# Patient Record
Sex: Female | Born: 1935 | Race: White | Hispanic: No | Marital: Single
Health system: Southern US, Community
[De-identification: ages and names within clinical notes are randomized; demographics above are authoritative.]

---

## 2006-01-01 ENCOUNTER — Ambulatory Visit: Payer: Self-pay | Admitting: Internal Medicine

## 2006-06-19 ENCOUNTER — Ambulatory Visit (HOSPITAL_COMMUNITY): Admission: RE | Admit: 2006-06-19 | Discharge: 2006-06-20 | Payer: Self-pay | Admitting: Ophthalmology

## 2008-06-02 ENCOUNTER — Other Ambulatory Visit: Payer: Self-pay

## 2008-06-02 ENCOUNTER — Emergency Department: Payer: Self-pay | Admitting: Emergency Medicine

## 2008-06-26 ENCOUNTER — Ambulatory Visit: Payer: Self-pay | Admitting: Vascular Surgery

## 2011-08-03 ENCOUNTER — Encounter (INDEPENDENT_AMBULATORY_CARE_PROVIDER_SITE_OTHER): Payer: Medicare Other | Admitting: Ophthalmology

## 2011-08-03 DIAGNOSIS — H43819 Vitreous degeneration, unspecified eye: Secondary | ICD-10-CM

## 2011-08-03 DIAGNOSIS — H3530 Unspecified macular degeneration: Secondary | ICD-10-CM

## 2011-08-03 DIAGNOSIS — H2510 Age-related nuclear cataract, unspecified eye: Secondary | ICD-10-CM

## 2011-08-03 DIAGNOSIS — H353290 Exudative age-related macular degeneration, unspecified eye, stage unspecified: Secondary | ICD-10-CM

## 2011-09-21 ENCOUNTER — Encounter (INDEPENDENT_AMBULATORY_CARE_PROVIDER_SITE_OTHER): Payer: Medicare Other | Admitting: Ophthalmology

## 2011-09-21 DIAGNOSIS — H353290 Exudative age-related macular degeneration, unspecified eye, stage unspecified: Secondary | ICD-10-CM

## 2011-09-21 DIAGNOSIS — H3530 Unspecified macular degeneration: Secondary | ICD-10-CM

## 2011-09-21 DIAGNOSIS — H43819 Vitreous degeneration, unspecified eye: Secondary | ICD-10-CM

## 2011-11-02 ENCOUNTER — Encounter (INDEPENDENT_AMBULATORY_CARE_PROVIDER_SITE_OTHER): Payer: Medicare Other | Admitting: Ophthalmology

## 2011-11-02 DIAGNOSIS — H3530 Unspecified macular degeneration: Secondary | ICD-10-CM

## 2011-11-02 DIAGNOSIS — H43819 Vitreous degeneration, unspecified eye: Secondary | ICD-10-CM

## 2011-11-02 DIAGNOSIS — H353290 Exudative age-related macular degeneration, unspecified eye, stage unspecified: Secondary | ICD-10-CM

## 2011-12-20 ENCOUNTER — Encounter (INDEPENDENT_AMBULATORY_CARE_PROVIDER_SITE_OTHER): Payer: Medicare Other | Admitting: Ophthalmology

## 2011-12-20 DIAGNOSIS — H43819 Vitreous degeneration, unspecified eye: Secondary | ICD-10-CM

## 2011-12-20 DIAGNOSIS — I10 Essential (primary) hypertension: Secondary | ICD-10-CM

## 2011-12-20 DIAGNOSIS — H35039 Hypertensive retinopathy, unspecified eye: Secondary | ICD-10-CM

## 2011-12-20 DIAGNOSIS — H3530 Unspecified macular degeneration: Secondary | ICD-10-CM

## 2011-12-20 DIAGNOSIS — H353290 Exudative age-related macular degeneration, unspecified eye, stage unspecified: Secondary | ICD-10-CM

## 2011-12-21 ENCOUNTER — Encounter (INDEPENDENT_AMBULATORY_CARE_PROVIDER_SITE_OTHER): Payer: Medicare Other | Admitting: Ophthalmology

## 2012-02-07 ENCOUNTER — Encounter (INDEPENDENT_AMBULATORY_CARE_PROVIDER_SITE_OTHER): Payer: Medicare Other | Admitting: Ophthalmology

## 2012-02-07 DIAGNOSIS — H3530 Unspecified macular degeneration: Secondary | ICD-10-CM

## 2012-02-07 DIAGNOSIS — H43819 Vitreous degeneration, unspecified eye: Secondary | ICD-10-CM

## 2012-02-07 DIAGNOSIS — H2510 Age-related nuclear cataract, unspecified eye: Secondary | ICD-10-CM

## 2012-02-07 DIAGNOSIS — I10 Essential (primary) hypertension: Secondary | ICD-10-CM

## 2012-02-07 DIAGNOSIS — H353290 Exudative age-related macular degeneration, unspecified eye, stage unspecified: Secondary | ICD-10-CM

## 2012-02-07 DIAGNOSIS — H35039 Hypertensive retinopathy, unspecified eye: Secondary | ICD-10-CM

## 2012-03-27 ENCOUNTER — Encounter (INDEPENDENT_AMBULATORY_CARE_PROVIDER_SITE_OTHER): Payer: Medicare Other | Admitting: Ophthalmology

## 2012-03-27 DIAGNOSIS — H43819 Vitreous degeneration, unspecified eye: Secondary | ICD-10-CM

## 2012-03-27 DIAGNOSIS — H353290 Exudative age-related macular degeneration, unspecified eye, stage unspecified: Secondary | ICD-10-CM

## 2012-03-27 DIAGNOSIS — H35039 Hypertensive retinopathy, unspecified eye: Secondary | ICD-10-CM

## 2012-03-27 DIAGNOSIS — H2510 Age-related nuclear cataract, unspecified eye: Secondary | ICD-10-CM

## 2012-03-27 DIAGNOSIS — I10 Essential (primary) hypertension: Secondary | ICD-10-CM

## 2012-03-27 DIAGNOSIS — H3530 Unspecified macular degeneration: Secondary | ICD-10-CM

## 2012-05-22 ENCOUNTER — Encounter (INDEPENDENT_AMBULATORY_CARE_PROVIDER_SITE_OTHER): Payer: Medicare Other | Admitting: Ophthalmology

## 2012-05-22 DIAGNOSIS — H43819 Vitreous degeneration, unspecified eye: Secondary | ICD-10-CM

## 2012-05-22 DIAGNOSIS — H35039 Hypertensive retinopathy, unspecified eye: Secondary | ICD-10-CM

## 2012-05-22 DIAGNOSIS — H3530 Unspecified macular degeneration: Secondary | ICD-10-CM

## 2012-05-22 DIAGNOSIS — I10 Essential (primary) hypertension: Secondary | ICD-10-CM

## 2012-05-22 DIAGNOSIS — H353290 Exudative age-related macular degeneration, unspecified eye, stage unspecified: Secondary | ICD-10-CM

## 2012-05-22 DIAGNOSIS — H2510 Age-related nuclear cataract, unspecified eye: Secondary | ICD-10-CM

## 2012-07-10 ENCOUNTER — Encounter (INDEPENDENT_AMBULATORY_CARE_PROVIDER_SITE_OTHER): Payer: Medicare Other | Admitting: Ophthalmology

## 2012-07-10 DIAGNOSIS — H43819 Vitreous degeneration, unspecified eye: Secondary | ICD-10-CM

## 2012-07-10 DIAGNOSIS — H3530 Unspecified macular degeneration: Secondary | ICD-10-CM

## 2012-07-10 DIAGNOSIS — H353290 Exudative age-related macular degeneration, unspecified eye, stage unspecified: Secondary | ICD-10-CM

## 2012-07-10 DIAGNOSIS — H2510 Age-related nuclear cataract, unspecified eye: Secondary | ICD-10-CM

## 2012-07-10 DIAGNOSIS — H35039 Hypertensive retinopathy, unspecified eye: Secondary | ICD-10-CM

## 2012-07-10 DIAGNOSIS — I10 Essential (primary) hypertension: Secondary | ICD-10-CM

## 2012-08-21 ENCOUNTER — Encounter (INDEPENDENT_AMBULATORY_CARE_PROVIDER_SITE_OTHER): Payer: Medicare Other | Admitting: Ophthalmology

## 2012-08-21 DIAGNOSIS — H3530 Unspecified macular degeneration: Secondary | ICD-10-CM

## 2012-08-21 DIAGNOSIS — H35039 Hypertensive retinopathy, unspecified eye: Secondary | ICD-10-CM

## 2012-08-21 DIAGNOSIS — H43819 Vitreous degeneration, unspecified eye: Secondary | ICD-10-CM

## 2012-08-21 DIAGNOSIS — H353290 Exudative age-related macular degeneration, unspecified eye, stage unspecified: Secondary | ICD-10-CM

## 2012-08-21 DIAGNOSIS — I10 Essential (primary) hypertension: Secondary | ICD-10-CM

## 2012-10-02 ENCOUNTER — Encounter (INDEPENDENT_AMBULATORY_CARE_PROVIDER_SITE_OTHER): Payer: Medicare Other | Admitting: Ophthalmology

## 2012-10-02 DIAGNOSIS — H353290 Exudative age-related macular degeneration, unspecified eye, stage unspecified: Secondary | ICD-10-CM

## 2012-10-02 DIAGNOSIS — H35039 Hypertensive retinopathy, unspecified eye: Secondary | ICD-10-CM

## 2012-10-02 DIAGNOSIS — I10 Essential (primary) hypertension: Secondary | ICD-10-CM

## 2012-10-02 DIAGNOSIS — H2510 Age-related nuclear cataract, unspecified eye: Secondary | ICD-10-CM

## 2012-10-02 DIAGNOSIS — H43819 Vitreous degeneration, unspecified eye: Secondary | ICD-10-CM

## 2012-10-02 DIAGNOSIS — H3530 Unspecified macular degeneration: Secondary | ICD-10-CM

## 2012-11-13 ENCOUNTER — Encounter (INDEPENDENT_AMBULATORY_CARE_PROVIDER_SITE_OTHER): Payer: Medicare Other | Admitting: Ophthalmology

## 2012-11-13 DIAGNOSIS — H3530 Unspecified macular degeneration: Secondary | ICD-10-CM

## 2012-11-13 DIAGNOSIS — H2510 Age-related nuclear cataract, unspecified eye: Secondary | ICD-10-CM

## 2012-11-13 DIAGNOSIS — H35039 Hypertensive retinopathy, unspecified eye: Secondary | ICD-10-CM

## 2012-11-13 DIAGNOSIS — H353290 Exudative age-related macular degeneration, unspecified eye, stage unspecified: Secondary | ICD-10-CM

## 2012-11-13 DIAGNOSIS — H43819 Vitreous degeneration, unspecified eye: Secondary | ICD-10-CM

## 2012-11-13 DIAGNOSIS — I10 Essential (primary) hypertension: Secondary | ICD-10-CM

## 2012-12-25 ENCOUNTER — Encounter (INDEPENDENT_AMBULATORY_CARE_PROVIDER_SITE_OTHER): Payer: Medicare Other | Admitting: Ophthalmology

## 2012-12-27 ENCOUNTER — Encounter (INDEPENDENT_AMBULATORY_CARE_PROVIDER_SITE_OTHER): Payer: Medicare Other | Admitting: Ophthalmology

## 2012-12-27 DIAGNOSIS — H353290 Exudative age-related macular degeneration, unspecified eye, stage unspecified: Secondary | ICD-10-CM

## 2012-12-27 DIAGNOSIS — H43819 Vitreous degeneration, unspecified eye: Secondary | ICD-10-CM

## 2012-12-27 DIAGNOSIS — I10 Essential (primary) hypertension: Secondary | ICD-10-CM

## 2012-12-27 DIAGNOSIS — H35039 Hypertensive retinopathy, unspecified eye: Secondary | ICD-10-CM

## 2012-12-27 DIAGNOSIS — H3530 Unspecified macular degeneration: Secondary | ICD-10-CM

## 2013-01-29 ENCOUNTER — Encounter (INDEPENDENT_AMBULATORY_CARE_PROVIDER_SITE_OTHER): Payer: Medicare Other | Admitting: Ophthalmology

## 2013-01-29 DIAGNOSIS — H43819 Vitreous degeneration, unspecified eye: Secondary | ICD-10-CM

## 2013-01-29 DIAGNOSIS — H3530 Unspecified macular degeneration: Secondary | ICD-10-CM

## 2013-01-29 DIAGNOSIS — H35039 Hypertensive retinopathy, unspecified eye: Secondary | ICD-10-CM

## 2013-01-29 DIAGNOSIS — H353290 Exudative age-related macular degeneration, unspecified eye, stage unspecified: Secondary | ICD-10-CM

## 2013-01-29 DIAGNOSIS — I10 Essential (primary) hypertension: Secondary | ICD-10-CM

## 2013-01-29 DIAGNOSIS — H2510 Age-related nuclear cataract, unspecified eye: Secondary | ICD-10-CM

## 2013-03-05 ENCOUNTER — Encounter (INDEPENDENT_AMBULATORY_CARE_PROVIDER_SITE_OTHER): Payer: Medicare Other | Admitting: Ophthalmology

## 2013-03-05 DIAGNOSIS — H43819 Vitreous degeneration, unspecified eye: Secondary | ICD-10-CM

## 2013-03-05 DIAGNOSIS — H3530 Unspecified macular degeneration: Secondary | ICD-10-CM

## 2013-03-05 DIAGNOSIS — H2510 Age-related nuclear cataract, unspecified eye: Secondary | ICD-10-CM

## 2013-03-05 DIAGNOSIS — H353290 Exudative age-related macular degeneration, unspecified eye, stage unspecified: Secondary | ICD-10-CM

## 2013-03-05 DIAGNOSIS — H35039 Hypertensive retinopathy, unspecified eye: Secondary | ICD-10-CM

## 2013-03-05 DIAGNOSIS — I10 Essential (primary) hypertension: Secondary | ICD-10-CM

## 2013-04-01 ENCOUNTER — Encounter (INDEPENDENT_AMBULATORY_CARE_PROVIDER_SITE_OTHER): Payer: Medicare Other | Admitting: Ophthalmology

## 2013-04-01 DIAGNOSIS — I10 Essential (primary) hypertension: Secondary | ICD-10-CM

## 2013-04-01 DIAGNOSIS — H43819 Vitreous degeneration, unspecified eye: Secondary | ICD-10-CM

## 2013-04-01 DIAGNOSIS — H353290 Exudative age-related macular degeneration, unspecified eye, stage unspecified: Secondary | ICD-10-CM

## 2013-04-01 DIAGNOSIS — H35039 Hypertensive retinopathy, unspecified eye: Secondary | ICD-10-CM

## 2013-04-01 DIAGNOSIS — H3530 Unspecified macular degeneration: Secondary | ICD-10-CM

## 2013-05-07 ENCOUNTER — Encounter (INDEPENDENT_AMBULATORY_CARE_PROVIDER_SITE_OTHER): Payer: Medicare Other | Admitting: Ophthalmology

## 2013-05-07 DIAGNOSIS — H2510 Age-related nuclear cataract, unspecified eye: Secondary | ICD-10-CM

## 2013-05-07 DIAGNOSIS — H43819 Vitreous degeneration, unspecified eye: Secondary | ICD-10-CM

## 2013-05-07 DIAGNOSIS — H353290 Exudative age-related macular degeneration, unspecified eye, stage unspecified: Secondary | ICD-10-CM

## 2013-05-07 DIAGNOSIS — H3530 Unspecified macular degeneration: Secondary | ICD-10-CM

## 2013-05-07 DIAGNOSIS — H35039 Hypertensive retinopathy, unspecified eye: Secondary | ICD-10-CM

## 2013-05-07 DIAGNOSIS — I10 Essential (primary) hypertension: Secondary | ICD-10-CM

## 2013-06-11 ENCOUNTER — Encounter (INDEPENDENT_AMBULATORY_CARE_PROVIDER_SITE_OTHER): Payer: Medicare Other | Admitting: Ophthalmology

## 2013-06-11 DIAGNOSIS — H2510 Age-related nuclear cataract, unspecified eye: Secondary | ICD-10-CM

## 2013-06-11 DIAGNOSIS — S0550XA Penetrating wound with foreign body of unspecified eyeball, initial encounter: Secondary | ICD-10-CM

## 2013-06-11 DIAGNOSIS — H3530 Unspecified macular degeneration: Secondary | ICD-10-CM

## 2013-06-11 DIAGNOSIS — H43819 Vitreous degeneration, unspecified eye: Secondary | ICD-10-CM

## 2013-06-11 DIAGNOSIS — I10 Essential (primary) hypertension: Secondary | ICD-10-CM

## 2013-06-11 DIAGNOSIS — H35039 Hypertensive retinopathy, unspecified eye: Secondary | ICD-10-CM

## 2013-07-16 ENCOUNTER — Encounter (INDEPENDENT_AMBULATORY_CARE_PROVIDER_SITE_OTHER): Payer: Medicare Other | Admitting: Ophthalmology

## 2013-07-16 DIAGNOSIS — H353290 Exudative age-related macular degeneration, unspecified eye, stage unspecified: Secondary | ICD-10-CM

## 2013-07-16 DIAGNOSIS — H35039 Hypertensive retinopathy, unspecified eye: Secondary | ICD-10-CM

## 2013-07-16 DIAGNOSIS — H3530 Unspecified macular degeneration: Secondary | ICD-10-CM

## 2013-07-16 DIAGNOSIS — I10 Essential (primary) hypertension: Secondary | ICD-10-CM

## 2013-07-16 DIAGNOSIS — H43819 Vitreous degeneration, unspecified eye: Secondary | ICD-10-CM

## 2013-08-20 ENCOUNTER — Encounter (INDEPENDENT_AMBULATORY_CARE_PROVIDER_SITE_OTHER): Payer: Medicare Other | Admitting: Ophthalmology

## 2013-08-20 DIAGNOSIS — H353290 Exudative age-related macular degeneration, unspecified eye, stage unspecified: Secondary | ICD-10-CM

## 2013-08-20 DIAGNOSIS — H35039 Hypertensive retinopathy, unspecified eye: Secondary | ICD-10-CM

## 2013-08-20 DIAGNOSIS — H43819 Vitreous degeneration, unspecified eye: Secondary | ICD-10-CM

## 2013-08-20 DIAGNOSIS — H2510 Age-related nuclear cataract, unspecified eye: Secondary | ICD-10-CM

## 2013-08-20 DIAGNOSIS — I10 Essential (primary) hypertension: Secondary | ICD-10-CM

## 2013-08-20 DIAGNOSIS — H3530 Unspecified macular degeneration: Secondary | ICD-10-CM

## 2013-09-17 ENCOUNTER — Encounter (INDEPENDENT_AMBULATORY_CARE_PROVIDER_SITE_OTHER): Payer: Medicare Other | Admitting: Ophthalmology

## 2013-09-17 DIAGNOSIS — I10 Essential (primary) hypertension: Secondary | ICD-10-CM

## 2013-09-17 DIAGNOSIS — H43819 Vitreous degeneration, unspecified eye: Secondary | ICD-10-CM

## 2013-09-17 DIAGNOSIS — H35039 Hypertensive retinopathy, unspecified eye: Secondary | ICD-10-CM

## 2013-09-17 DIAGNOSIS — H2510 Age-related nuclear cataract, unspecified eye: Secondary | ICD-10-CM

## 2013-09-17 DIAGNOSIS — H3530 Unspecified macular degeneration: Secondary | ICD-10-CM

## 2013-09-17 DIAGNOSIS — H353290 Exudative age-related macular degeneration, unspecified eye, stage unspecified: Secondary | ICD-10-CM

## 2013-10-15 ENCOUNTER — Encounter (INDEPENDENT_AMBULATORY_CARE_PROVIDER_SITE_OTHER): Payer: Medicare Other | Admitting: Ophthalmology

## 2013-10-19 ENCOUNTER — Inpatient Hospital Stay: Payer: Self-pay | Admitting: Internal Medicine

## 2013-10-19 LAB — CK TOTAL AND CKMB (NOT AT ARMC)
CK, Total: 66 U/L (ref 21–215)
CK, Total: 68 U/L (ref 21–215)
CK, Total: 105 U/L (ref 21–215)
CK-MB: 5 ng/mL — ABNORMAL HIGH (ref 0.5–3.6)

## 2013-10-19 LAB — COMPREHENSIVE METABOLIC PANEL
Albumin: 2.4 g/dL — ABNORMAL LOW (ref 3.4–5.0)
Anion Gap: 8 (ref 7–16)
Calcium, Total: 8.3 mg/dL — ABNORMAL LOW (ref 8.5–10.1)
Co2: 21 mmol/L (ref 21–32)
Creatinine: 1.93 mg/dL — ABNORMAL HIGH (ref 0.60–1.30)
EGFR (African American): 26 — ABNORMAL LOW
Glucose: 143 mg/dL — ABNORMAL HIGH (ref 65–99)
Potassium: 5.1 mmol/L (ref 3.5–5.1)
Total Protein: 5.6 g/dL — ABNORMAL LOW (ref 6.4–8.2)

## 2013-10-19 LAB — CBC
HCT: 31.8 % — ABNORMAL LOW (ref 35.0–47.0)
MCHC: 33.3 g/dL (ref 32.0–36.0)
Platelet: 310 10*3/uL (ref 150–440)
RBC: 3.5 10*6/uL — ABNORMAL LOW (ref 3.80–5.20)
RDW: 13.2 % (ref 11.5–14.5)

## 2013-10-19 LAB — PRO B NATRIURETIC PEPTIDE: B-Type Natriuretic Peptide: 8038 pg/mL — ABNORMAL HIGH (ref 0–450)

## 2013-10-20 DIAGNOSIS — I059 Rheumatic mitral valve disease, unspecified: Secondary | ICD-10-CM

## 2013-10-20 LAB — COMPREHENSIVE METABOLIC PANEL
Anion Gap: 11 (ref 7–16)
Bilirubin,Total: 0.3 mg/dL (ref 0.2–1.0)
Chloride: 89 mmol/L — ABNORMAL LOW (ref 98–107)
Glucose: 112 mg/dL — ABNORMAL HIGH (ref 65–99)
Osmolality: 253 (ref 275–301)
Potassium: 4.4 mmol/L (ref 3.5–5.1)
SGPT (ALT): 23 U/L (ref 12–78)
Sodium: 120 mmol/L — CL (ref 136–145)

## 2013-10-20 LAB — URINALYSIS, COMPLETE
Hyaline Cast: 2
Ketone: NEGATIVE
Nitrite: NEGATIVE
Ph: 5 (ref 4.5–8.0)
Protein: NEGATIVE
RBC,UR: 1 /HPF (ref 0–5)
Squamous Epithelial: 1

## 2013-10-21 LAB — BASIC METABOLIC PANEL
BUN: 44 mg/dL — ABNORMAL HIGH (ref 7–18)
Co2: 21 mmol/L (ref 21–32)
Creatinine: 1.73 mg/dL — ABNORMAL HIGH (ref 0.60–1.30)
EGFR (African American): 29 — ABNORMAL LOW
Osmolality: 255 (ref 275–301)

## 2013-10-22 ENCOUNTER — Encounter (INDEPENDENT_AMBULATORY_CARE_PROVIDER_SITE_OTHER): Payer: Medicare Other | Admitting: Ophthalmology

## 2013-10-22 LAB — OSMOLALITY, URINE: Osmolality: 320 mOsm/kg

## 2013-10-22 LAB — BASIC METABOLIC PANEL
Anion Gap: 6 — ABNORMAL LOW (ref 7–16)
BUN: 47 mg/dL — ABNORMAL HIGH (ref 7–18)
Creatinine: 1.52 mg/dL — ABNORMAL HIGH (ref 0.60–1.30)
EGFR (African American): 34 — ABNORMAL LOW
EGFR (Non-African Amer.): 29 — ABNORMAL LOW
Osmolality: 258 (ref 275–301)
Potassium: 4.2 mmol/L (ref 3.5–5.1)
Sodium: 122 mmol/L — ABNORMAL LOW (ref 136–145)

## 2013-10-23 LAB — BASIC METABOLIC PANEL
Anion Gap: 8 (ref 7–16)
BUN: 64 mg/dL — ABNORMAL HIGH (ref 7–18)
BUN: 68 mg/dL — ABNORMAL HIGH (ref 7–18)
Chloride: 90 mmol/L — ABNORMAL LOW (ref 98–107)
Chloride: 90 mmol/L — ABNORMAL LOW (ref 98–107)
Co2: 26 mmol/L (ref 21–32)
Creatinine: 1.73 mg/dL — ABNORMAL HIGH (ref 0.60–1.30)
Creatinine: 1.76 mg/dL — ABNORMAL HIGH (ref 0.60–1.30)
EGFR (Non-African Amer.): 25 — ABNORMAL LOW
Glucose: 148 mg/dL — ABNORMAL HIGH (ref 65–99)
Potassium: 4.2 mmol/L (ref 3.5–5.1)
Potassium: 4.3 mmol/L (ref 3.5–5.1)

## 2013-10-24 LAB — RENAL FUNCTION PANEL
Albumin: 2.3 g/dL — ABNORMAL LOW (ref 3.4–5.0)
Chloride: 89 mmol/L — ABNORMAL LOW (ref 98–107)
Co2: 29 mmol/L (ref 21–32)
Creatinine: 1.9 mg/dL — ABNORMAL HIGH (ref 0.60–1.30)
EGFR (African American): 26 — ABNORMAL LOW
Osmolality: 274 (ref 275–301)
Potassium: 4.2 mmol/L (ref 3.5–5.1)

## 2013-10-24 LAB — SODIUM: Sodium: 126 mmol/L — ABNORMAL LOW (ref 136–145)

## 2013-10-25 LAB — BASIC METABOLIC PANEL
Anion Gap: 6 — ABNORMAL LOW (ref 7–16)
Calcium, Total: 8.3 mg/dL — ABNORMAL LOW (ref 8.5–10.1)
Co2: 26 mmol/L (ref 21–32)
Creatinine: 2.07 mg/dL — ABNORMAL HIGH (ref 0.60–1.30)
EGFR (Non-African Amer.): 20 — ABNORMAL LOW
Glucose: 106 mg/dL — ABNORMAL HIGH (ref 65–99)
Osmolality: 286 (ref 275–301)

## 2013-10-26 LAB — BASIC METABOLIC PANEL
Anion Gap: 7 (ref 7–16)
BUN: 96 mg/dL — ABNORMAL HIGH (ref 7–18)
Co2: 25 mmol/L (ref 21–32)
EGFR (African American): 24 — ABNORMAL LOW
Glucose: 156 mg/dL — ABNORMAL HIGH (ref 65–99)

## 2013-10-27 ENCOUNTER — Encounter: Payer: Self-pay | Admitting: Internal Medicine

## 2013-10-27 LAB — BASIC METABOLIC PANEL
Anion Gap: 8 (ref 7–16)
BUN: 93 mg/dL — ABNORMAL HIGH (ref 7–18)
Calcium, Total: 8.6 mg/dL (ref 8.5–10.1)
Co2: 23 mmol/L (ref 21–32)
EGFR (African American): 26 — ABNORMAL LOW
EGFR (Non-African Amer.): 22 — ABNORMAL LOW
Potassium: 4.7 mmol/L (ref 3.5–5.1)
Sodium: 129 mmol/L — ABNORMAL LOW (ref 136–145)

## 2013-10-31 LAB — BASIC METABOLIC PANEL
Anion Gap: 11 (ref 7–16)
Calcium, Total: 9 mg/dL (ref 8.5–10.1)
Chloride: 102 mmol/L (ref 98–107)
Co2: 21 mmol/L (ref 21–32)
Creatinine: 1.98 mg/dL — ABNORMAL HIGH (ref 0.60–1.30)
Glucose: 84 mg/dL (ref 65–99)
Osmolality: 288 (ref 275–301)
Potassium: 5.2 mmol/L — ABNORMAL HIGH (ref 3.5–5.1)
Sodium: 134 mmol/L — ABNORMAL LOW (ref 136–145)

## 2013-11-03 ENCOUNTER — Encounter: Payer: Self-pay | Admitting: Internal Medicine

## 2013-11-05 ENCOUNTER — Encounter (INDEPENDENT_AMBULATORY_CARE_PROVIDER_SITE_OTHER): Payer: Medicare Other | Admitting: Ophthalmology

## 2013-11-05 DIAGNOSIS — I10 Essential (primary) hypertension: Secondary | ICD-10-CM

## 2013-11-05 DIAGNOSIS — H2510 Age-related nuclear cataract, unspecified eye: Secondary | ICD-10-CM

## 2013-11-05 DIAGNOSIS — H353290 Exudative age-related macular degeneration, unspecified eye, stage unspecified: Secondary | ICD-10-CM

## 2013-11-05 DIAGNOSIS — H35039 Hypertensive retinopathy, unspecified eye: Secondary | ICD-10-CM

## 2013-11-05 DIAGNOSIS — H3530 Unspecified macular degeneration: Secondary | ICD-10-CM

## 2013-11-05 DIAGNOSIS — H43819 Vitreous degeneration, unspecified eye: Secondary | ICD-10-CM

## 2013-12-04 ENCOUNTER — Encounter: Payer: Self-pay | Admitting: Internal Medicine

## 2013-12-24 ENCOUNTER — Encounter (INDEPENDENT_AMBULATORY_CARE_PROVIDER_SITE_OTHER): Payer: Medicare Other | Admitting: Ophthalmology

## 2013-12-24 DIAGNOSIS — H43819 Vitreous degeneration, unspecified eye: Secondary | ICD-10-CM

## 2013-12-24 DIAGNOSIS — H353290 Exudative age-related macular degeneration, unspecified eye, stage unspecified: Secondary | ICD-10-CM

## 2013-12-24 DIAGNOSIS — H35039 Hypertensive retinopathy, unspecified eye: Secondary | ICD-10-CM

## 2013-12-24 DIAGNOSIS — H3530 Unspecified macular degeneration: Secondary | ICD-10-CM

## 2013-12-24 DIAGNOSIS — H2510 Age-related nuclear cataract, unspecified eye: Secondary | ICD-10-CM

## 2013-12-24 DIAGNOSIS — I10 Essential (primary) hypertension: Secondary | ICD-10-CM

## 2014-01-14 ENCOUNTER — Ambulatory Visit: Payer: Self-pay | Admitting: Family Medicine

## 2014-01-20 ENCOUNTER — Emergency Department: Payer: Self-pay | Admitting: Emergency Medicine

## 2014-01-20 LAB — BASIC METABOLIC PANEL
ANION GAP: 11 (ref 7–16)
BUN: 30 mg/dL — AB (ref 7–18)
CALCIUM: 9.7 mg/dL (ref 8.5–10.1)
CO2: 19 mmol/L — AB (ref 21–32)
CREATININE: 2.06 mg/dL — AB (ref 0.60–1.30)
Chloride: 106 mmol/L (ref 98–107)
EGFR (Non-African Amer.): 20 — ABNORMAL LOW
GFR CALC AF AMER: 24 — AB
Glucose: 120 mg/dL — ABNORMAL HIGH (ref 65–99)
OSMOLALITY: 279 (ref 275–301)
POTASSIUM: 4.8 mmol/L (ref 3.5–5.1)
Sodium: 136 mmol/L (ref 136–145)

## 2014-02-11 ENCOUNTER — Encounter (INDEPENDENT_AMBULATORY_CARE_PROVIDER_SITE_OTHER): Payer: Medicare Other | Admitting: Ophthalmology

## 2014-02-11 DIAGNOSIS — H3530 Unspecified macular degeneration: Secondary | ICD-10-CM

## 2014-02-11 DIAGNOSIS — I10 Essential (primary) hypertension: Secondary | ICD-10-CM

## 2014-02-11 DIAGNOSIS — H43819 Vitreous degeneration, unspecified eye: Secondary | ICD-10-CM

## 2014-02-11 DIAGNOSIS — H35039 Hypertensive retinopathy, unspecified eye: Secondary | ICD-10-CM

## 2014-02-11 DIAGNOSIS — H2510 Age-related nuclear cataract, unspecified eye: Secondary | ICD-10-CM

## 2014-02-11 DIAGNOSIS — H353290 Exudative age-related macular degeneration, unspecified eye, stage unspecified: Secondary | ICD-10-CM

## 2014-04-01 ENCOUNTER — Encounter (INDEPENDENT_AMBULATORY_CARE_PROVIDER_SITE_OTHER): Payer: Medicare Other | Admitting: Ophthalmology

## 2014-04-01 DIAGNOSIS — H43819 Vitreous degeneration, unspecified eye: Secondary | ICD-10-CM

## 2014-04-01 DIAGNOSIS — H3530 Unspecified macular degeneration: Secondary | ICD-10-CM

## 2014-04-01 DIAGNOSIS — H35039 Hypertensive retinopathy, unspecified eye: Secondary | ICD-10-CM

## 2014-04-01 DIAGNOSIS — H353290 Exudative age-related macular degeneration, unspecified eye, stage unspecified: Secondary | ICD-10-CM

## 2014-04-01 DIAGNOSIS — I10 Essential (primary) hypertension: Secondary | ICD-10-CM

## 2014-05-06 ENCOUNTER — Encounter (INDEPENDENT_AMBULATORY_CARE_PROVIDER_SITE_OTHER): Payer: Medicare Other | Admitting: Ophthalmology

## 2014-05-06 DIAGNOSIS — H353290 Exudative age-related macular degeneration, unspecified eye, stage unspecified: Secondary | ICD-10-CM

## 2014-05-06 DIAGNOSIS — H3530 Unspecified macular degeneration: Secondary | ICD-10-CM

## 2014-05-06 DIAGNOSIS — H35039 Hypertensive retinopathy, unspecified eye: Secondary | ICD-10-CM

## 2014-05-06 DIAGNOSIS — H43819 Vitreous degeneration, unspecified eye: Secondary | ICD-10-CM

## 2014-05-06 DIAGNOSIS — I10 Essential (primary) hypertension: Secondary | ICD-10-CM

## 2014-06-10 ENCOUNTER — Encounter (INDEPENDENT_AMBULATORY_CARE_PROVIDER_SITE_OTHER): Payer: Medicare Other | Admitting: Ophthalmology

## 2014-08-04 DEATH — deceased

## 2015-03-26 NOTE — Discharge Summary (Signed)
PATIENT NAME:  Alison Byrd, [name] MR#:  [id number] DATE OF BIRTH:  Sep 21, 1919  DATE OF ADMISSION:  10/19/2013 DATE OF DISCHARGE:  10/27/2013  [facility]: [name].  PRESENTING COMPLAINT: Shortness of breath, leg swelling, and low sodium.   DISCHARGE DIAGNOSES: 1.  Congestive heart failure, acute new onset diastolic. 2.  Hyponatremia due to combination of congestive heart failure and hydrochlorothiazide, resolving.  3.  Leukocytosis, likely ongoing from sinus drainage.  4.  Chronic kidney disease, stage II/III. 5.  Hyperlipidemia.  6.  Hypertension.  7.  Epigastric discomfort, likely due to gastroesophageal reflux disease.  CONDITION ON DISCHARGE: Fair.   CODE STATUS: FULL CODE.   DIET: 2 grams sodium diet.   DISCHARGE INSTRUCTIONS: 1.  [facility], Dr. Thedore MinsSingh, in 2 weeks.  2.  Physical therapy.  3.  Follow up with Dr. Beckey DowningWillett in 2 weeks.  CONSULTANTS: Nephrology with Dr. Thedore MinsSingh.   DISCHARGE MEDICATIONS: 1.  TUMS 1000 mg q.i.d. p.r.n.  2.  TUMS 500 mg p.o. t.i.d. p.r.n.  3.  Melatonin 6 mg at bedtime.  4.  Milk of magnesium 30 mL p.o. b.i.d. for constipation.  5.  Omeprazole 40 mg p.o. daily.  6.  Felodipine 10 mg daily.  7.  Tylenol 650 mg q. 4 hours p.r.n.  8.  Aspirin 81 mg daily.  9.  Catapres 0.1 mg b.i.d.  10.  Losartan 100 mg daily.  11.  Simvastatin 20 mg at bedtime.  12.  Atenolol 25 mg p.o. daily.   LABORATORY AND DIAGNOSTICS: Creatinine is 1.92, glucose 191, sodium 129, potassium 4.7, and chloride 98.   Barium swallow: Markedly limited barium swallow without evidence of obstruction.   Echo Doppler shows EF of 55% to 60%. Normal left ventricular systolic function, mild concentrate left ventricular hypertrophy, moderate mitral valve regurg, mild aortic valve sclerosis without stenosis, mild TR.   UA negative for UTI. B-type natriuretic peptide was 8038. TSH 1.91. H and 10.6 and 31.8 and platelet count 310. Sodium on  admission was 121.   BRIEF SUMMARY OF HOSPITAL COURSE:  1.  Ms. Alison Byrd is a [age] year old Caucasian female who came in with increasing lower extremity edema, found to have elevated BNP. Chest x-ray concerning for pulmonary edema with likely congestive heart failure. She was admitted with congestive heart failure, new acute onset, acute diastolic. The patient was diuresed initially with high dose of Lasix. She is clinically not in CHF now. Lasix has been discontinued. Her ARBs and beta blockers were continued. She is doing well on room air.  2.  Hyponatremia. Due to combination of hydrochlorothiazide and CHF. Dr. Thedore MinsSingh was consulted. Her sodium improved from 121 to 131. She was on Lasix then got 1 or 2 doses of Conivaptan. Her sodium was still low hence she was put on 3% hypertonic saline which helped to get her sodium up to 131. She is euvolemic at this time and sats are 94% on room air.  3.  Leukocytosis of unclear etiology. Likely from her ongoing sinus drainage. She has no source of infection. UA and chest x-ray were negative. She remained afebrile.  4.  CKD, stage III. Creatinine at baseline. Follow up with Dr. Thedore MinsSingh as outpatient.  5.  Hyperlipidemia. On simvastatin.  6.  Hypertension. On atenolol, felodipine, and clonidine along with losartan.  7.  Epigastric discomfort, likely GERD. The patient will be placed on Prilosec.   Hospital stay otherwise remained stable. The discharge plan was discussed with the patient's daughter. She is  agreeable to it. The patient remained a FULL CODE.   TIME SPENT: 40 minutes.  ____________________________ Wylie Hail Allena Katz, MD sap:sb D: 10/27/2013 09:21:43 ET T: 10/27/2013 10:22:46 ET JOB#: [id number]  cc: Yuliana Vandrunen A. Allena Katz, MD, <Dictator> Mosetta Pigeon, MD Jorje Guild. Beckey Downing, MD Willow Ora MD ELECTRONICALLY SIGNED 10/27/2013 14:18

## 2015-03-26 NOTE — H&P (Signed)
PATIENT NAME:  VERL, WHITMORE MR#:  [id number] DATE OF BIRTH:  05-20-1919  DATE OF ADMISSION:  10/19/2013  PRIMARY CARE PHYSICIAN: Dr. Ilene Qua  CHIEF COMPLAINT: Increasing lower extremity edema.   HISTORY OF PRESENT ILLNESS: A 81 year old female with a history of heart murmur, hyperlipidemia and hypertension, who presents with the above complaint. Over the past few days, patient has had increasing lower extremity edema. She was seen by her PCP on Tuesday for a routine follow up. She was recently diagnosed with sinusitis. At that time, she had some mild lower extremity edema, but the family says that over the past few days it is increasing, and she does have some mild shortness of breath, so they brought her here for further valuation. In the ER, chest x-ray did show pulmonary edema as well as a BMP, which was elevated. There is no history of congestive heart failure. She denies any chest pain.   REVIEW OF SYSTEMS:  GENERAL:  No fever. Positive weakness and fatigue over the past week, but normally she is ambulatory and fully functional.  EYES:  No double vision or blurred vision. No inflammation or pain.  ENT:  Some mild hearing loss. No discharge. She was recently treated for a sinus infection. No postnasal drip. She was having some difficulty swallowing due to her sinusitis, which is resolving.  RESPIRATORY: No cough, wheezing, hemoptysis, dyspnea, COPD.   CARDIOVASCULAR: No chest pain, palpitations, orthopnea, syncope. Positive edema. No arrhythmia, dyspnea on exertion.  GASTROINTESTINAL:  No nausea, vomiting, diarrhea, abdominal pain, melena, ulcers. GENITOURINARY:  No dysuria or hematuria.  ENDOCRINE: No polyuria or polydipsia.  HEMATOLOGIC/LYMPHATIC:  Positive easy bruising. No anemia.  SKIN: No rash or lesions. MUSCULOSKELETAL:  No redness or gout or swelling.  NEUROLOGIC:  No history of CVA, TIA, seizures. PSYCHIATRIC:  No history of anxiety or depression.    PAST MEDICAL  HISTORY: 1.  Heart murmur. 2.  Hyperlipidemia.  3.  Hypertension.  4.  Chronic kidney disease.  MEDICATIONS: 1.  Tramadol 50 mg daily.  2.  Simvastatin 20 mg daily.  3.  Felodipine 10 mg daily.  4.  Cozaar 100 mg daily.  5.  Catapres 0.1 mg b.i.d.  6.  Atenolol 25 mg daily.  7.  Hydrochlorothiazide 25 mg daily.  8.  Aspirin 81 mg daily.  9.  Hydralazine 25 mg TID  ALLERGIES: No known drug allergies.   SURGICAL HISTORY:   1.  Hysterectomy. 2,  Hernia repair.  3.  Cholecystectomy.   FAMILY HISTORY: Positive for CVA, hypertension.   PHYSICAL EXAMINATION: VITAL SIGNS: Temperature 98.1, pulse is 56, respirations 18, blood pressure 167/52, 97% on room air.  GENERAL: The patient is alert, oriented, not in acute distress.  HEENT: Head is atraumatic. Pupils are reactive. Sclerae anicteric. Mucous membranes are dry.  Oropharynx is clear.  NECK: Supple. There is no JVD, carotid bruit or enlarged thyroid.  CARDIOVASCULAR: There is a 3/6 holosystolic murmur heard throughout the precordium, without any radiation. PMI is laterally displaced. No gallops or rubs. Regular rate and rhythm.  LUNGS: Crackles at the bases bilaterally, with normal chest expansion. No tactile fremitus, egophony, or rhonchi are heard.  ABDOMEN: Bowel sounds are present. Nontender, nondistended. No hepatosplenomegaly.  EXTREMITIES: She has 2+ pitting edema bilaterally and symmetrically.  NEUROLOGIC: Cranial nerves II through XII are grossly intact. There are no focal deficits. The patient is able to move all extremities.  MUSCULOSKELETAL: Good strength, no pathology to digits.  SKIN:  Without rashes or lesions.  LABORATORIES: White blood cells 14.6, hemoglobin 10.6, hematocrit 32, platelets 310. Troponin less than 0.02. TSH 1.91. BNP 8038. Sodium 121, potassium 5.1, chloride 92, bicarb 21, BUN 38, creatinine 1.93, glucose 143, calcium 8.3. Bilirubin 0.3, alk phos 130, ALT 22, AST 22, total protein 5.6, albumin 2.4. CK  66, CPK-MB 3.7.   Chest x-ray shows pulmonary edema. EKG shows bradycardia with a heart rate of 49 with right ventricular conduction delay.   ASSESSMENT AND PLAN: A 81 year old female who presents with increasing lower extremity edema, found to have elevated BNP. Chest x-ray concerning for pulmonary edema, with likely congestive heart failure.   1.  Congestive heart failure, new onset. Will order an echocardiogram to evaluate diastolic or systolic in nature. The patient does have a precordial heart murmur. Likely this is systolic and an aortic murmur. The patient will be treated with Lasix. Will continue ARB, monitoring her creatinine carefully. Beta blocker, nitroglycerin, Is and Os, and daily weights have been ordered as well. Also will check her cardiac enzymes.    2.  Hyponatremia. The patient is on HCTZ, which we will hold. This could be the cause of her hyponatremia, or it could be due to some decreased p.o. intake. Her TSH is normal. Will hold HCTZ, follow the sodium level. If it continues to drop, may consider a Nephrology consult. Her hyponatremia could also be in part due to her congestive heart failure. If we treat her congestive heart failure, her sodium level may increase.   3.  Leukocytosis of unclear etiology. No source of infection. Will check a urinalysis to rule out urinary tract infection.   4. Acute kidney injury. The patient's family does say that patient has some chronic kidney disease. I am not sure what her baseline is. I will go ahead and continue her ARB due to her congestive heart failure. She could have acute kidney injury secondary to congestive heart failure. Will monitor her Is and Os as well as her creatinine.   The patient is a FULL CODE STATUS.   TIME SPENT: Approximately 50 minutes.     ____________________________ Donell Beers. Benjie Karvonen, MD spm:mr D: 10/19/2013 17:28:57 ET T: 10/19/2013 18:58:48 ET JOB#: [id number]  cc: Yariana Hoaglund P. Benjie Karvonen, MD, <Dictator> Fonnie Jarvis.  Ilene Qua, MD  Donell Beers Lavern Maslow MD ELECTRONICALLY SIGNED 10/19/2013 20:58

## 2015-09-12 IMAGING — CR DG CHEST 1V PORT
1 series · 1 of 1 positions shown · non-contrast
Comparison: None.

CLINICAL DATA: Cough and shortness of breath. Swelling in the legs.

EXAM:
PORTABLE CHEST - 1 VIEW

[ap]
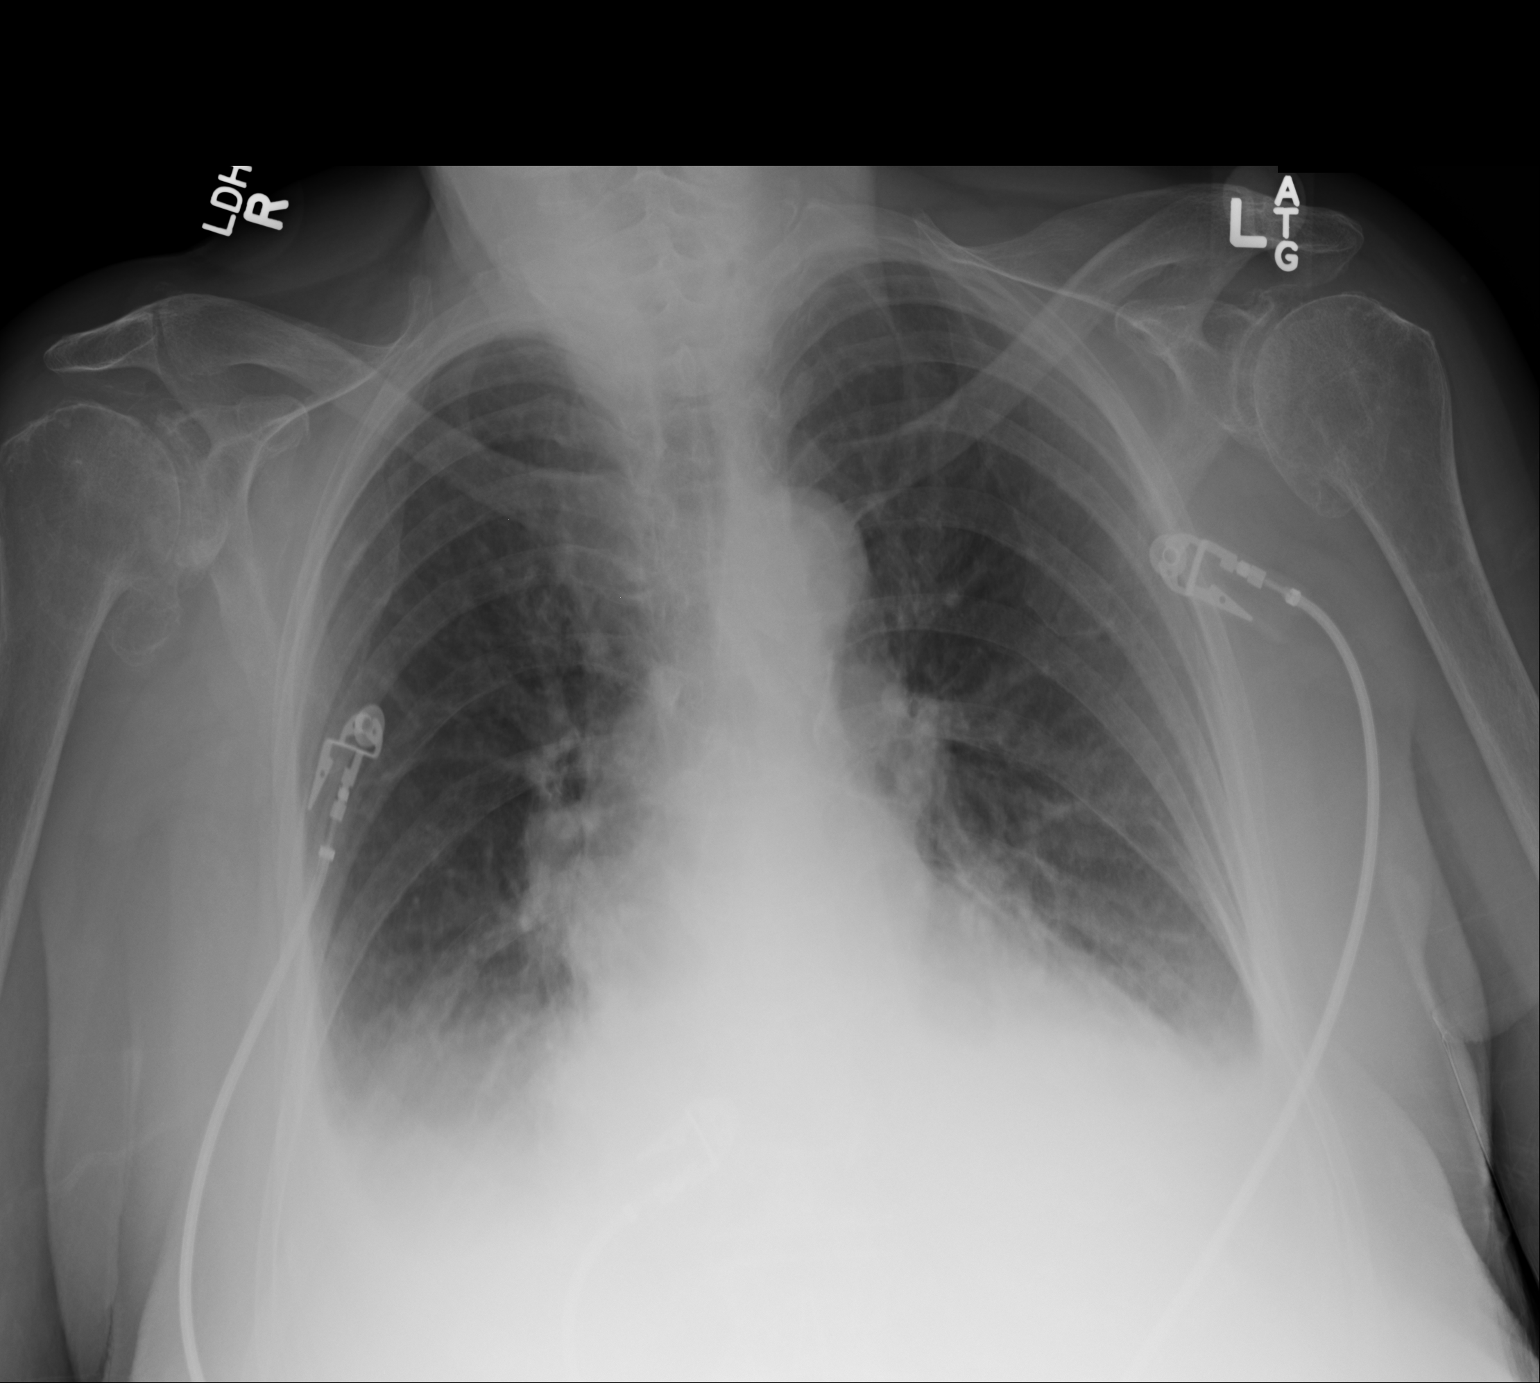

[1 of 1 positions shown; findings below may reference images not displayed]

FINDINGS: There is cardiomegaly with slight pulmonary vascular prominence.
There are small a moderate bilateral pleural effusions. No acute
osseous abnormality. Severe arthritic changes of both shoulders.
IMPRESSION: Moderate bilateral pleural effusions with slight pulmonary vascular
congestion and cardiomegaly. Findings are consistent with congestive
heart failure.
